# Patient Record
Sex: Male | Born: 1995 | Race: Black or African American | Hispanic: No | Marital: Single | Smoking: Current some day smoker
Health system: Southern US, Community
[De-identification: ages and names within clinical notes are randomized; demographics above are authoritative.]

---
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
## 2017-01-13 IMAGING — CR DG LUMBAR SPINE 2-3V
2 series · 2 of 2 positions shown · non-contrast
Comparison: None.

CLINICAL DATA: Mid lumbar spine pain no known injury

EXAM:
LUMBAR SPINE - 2-3 VIEW

[view not recorded (1 of 2)]
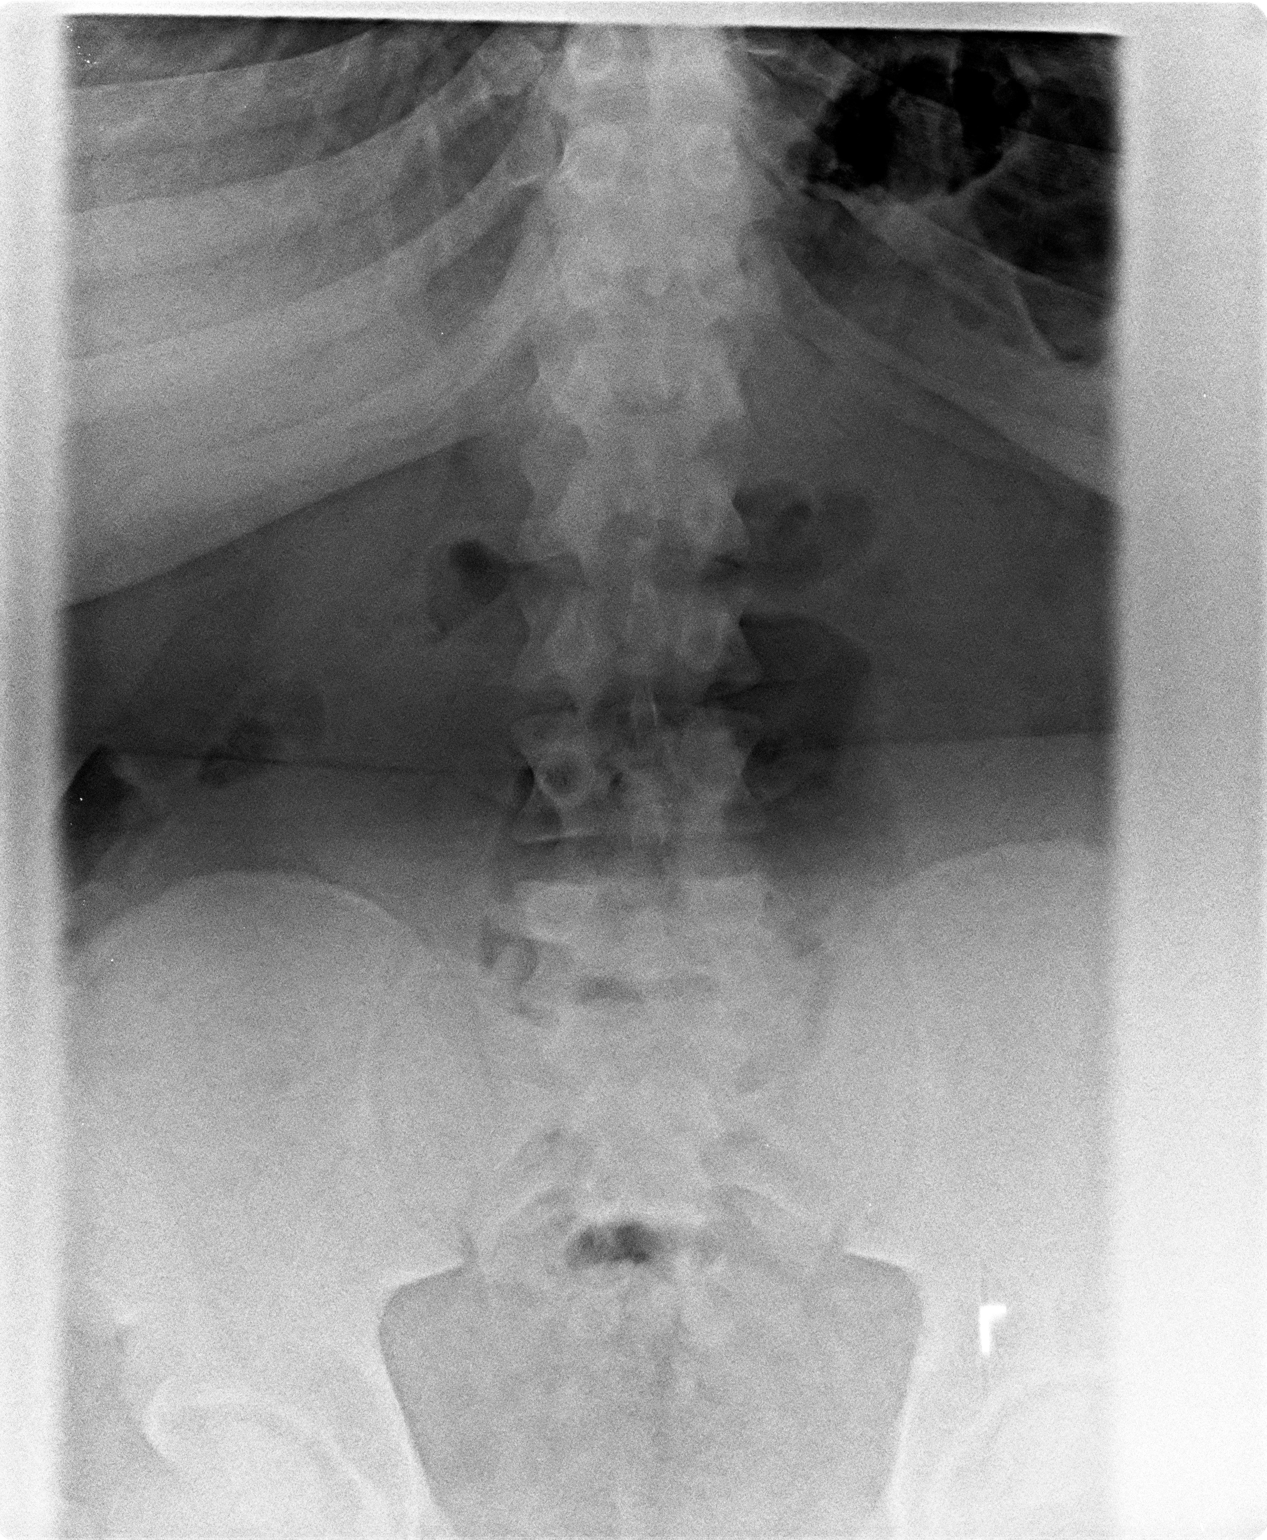

[view not recorded (2 of 2)]
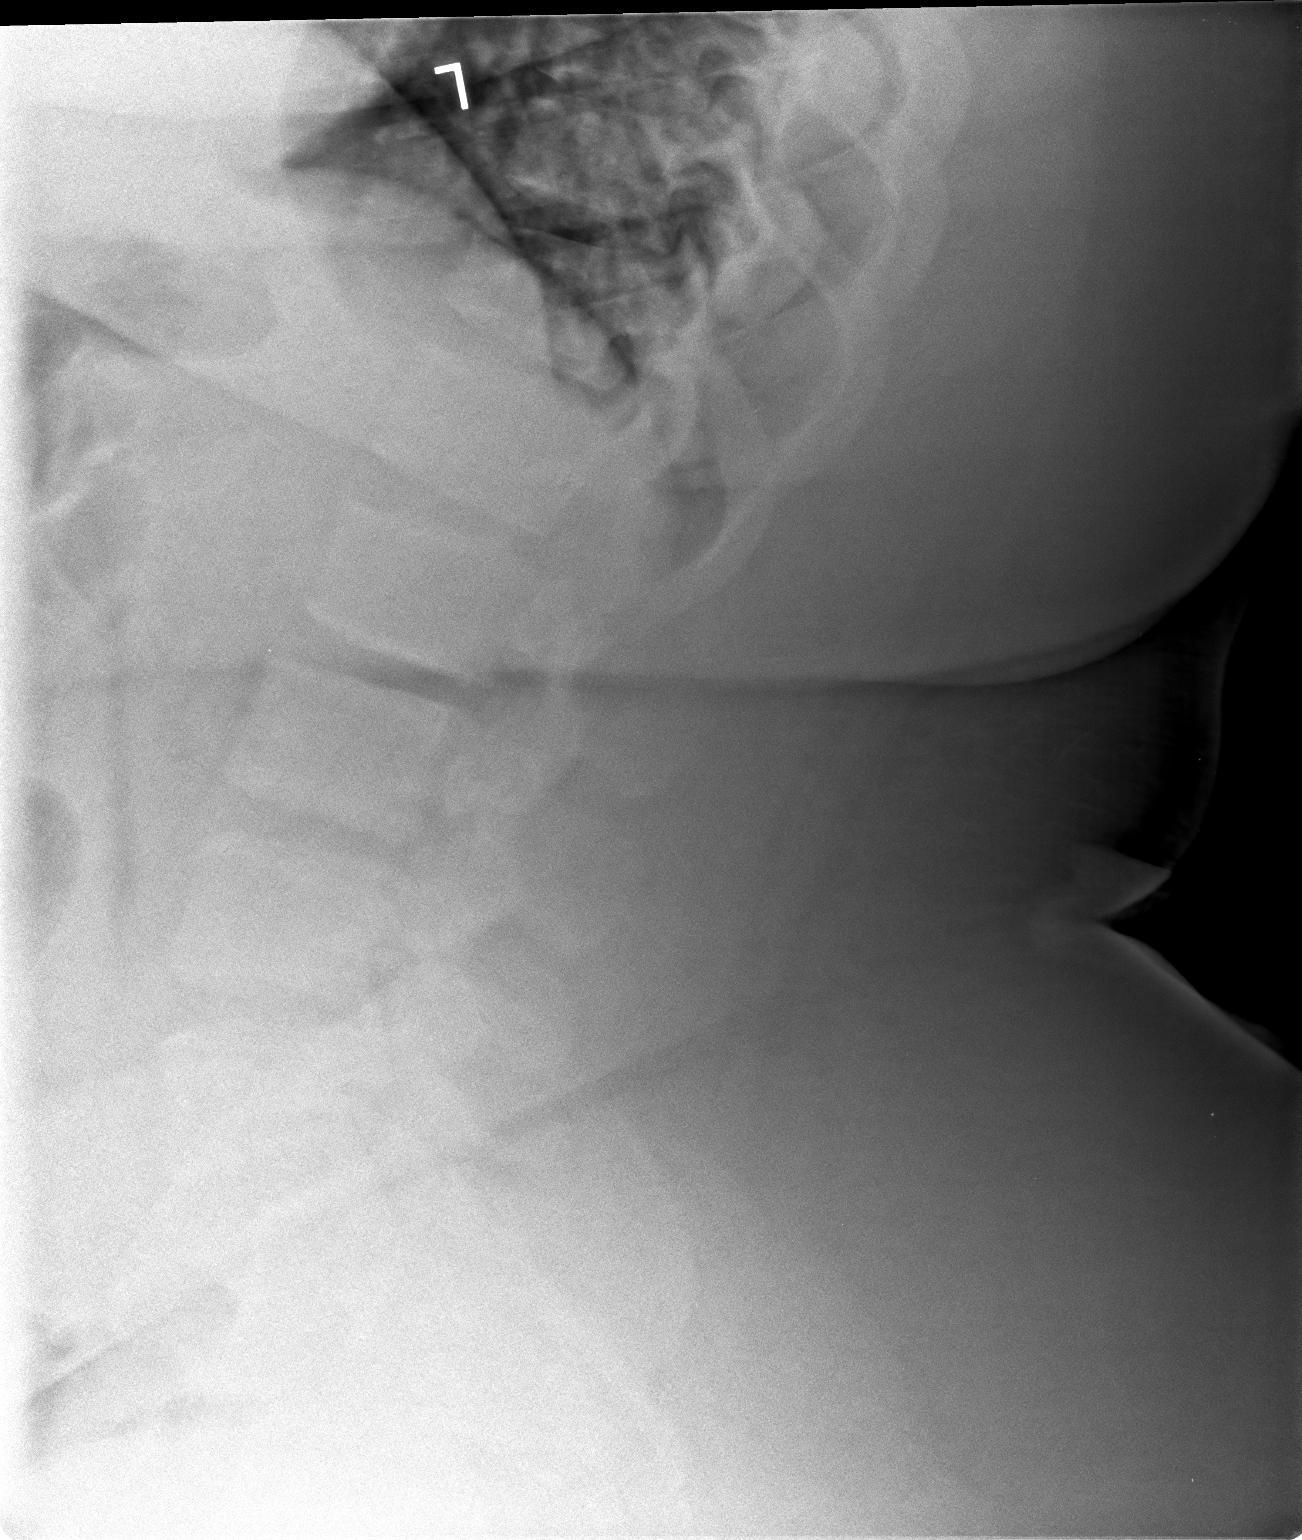

[2 of 2 positions shown; findings below may reference images not displayed]

FINDINGS: Study is significantly limited by patient body habitus. Normal
alignment. No fracture identified. Very mild multilevel spondylosis
with no significant disproportionate change at any particular level.
L5-S1 facet arthropathy appears to be present.
IMPRESSION: Mild degenerative change. Significantly limited study due to body
habitus.
